# Patient Record
Sex: Female | Born: 1995 | Race: Black or African American | Hispanic: No | Marital: Single | Smoking: Never smoker
Health system: Southern US, Community
[De-identification: ages and names within clinical notes are randomized; demographics above are authoritative.]

## PROBLEM LIST (undated history)

## (undated) HISTORY — PX: ECTOPIC PREGNANCY SURGERY: SHX613

---

## 2017-04-03 HISTORY — PX: UNILATERAL SALPINGECTOMY: SHX6160

## 2018-12-19 DIAGNOSIS — K0889 Other specified disorders of teeth and supporting structures: Secondary | ICD-10-CM | POA: Diagnosis not present

## 2018-12-19 DIAGNOSIS — R220 Localized swelling, mass and lump, head: Secondary | ICD-10-CM | POA: Diagnosis not present

## 2018-12-19 DIAGNOSIS — R51 Headache: Secondary | ICD-10-CM | POA: Diagnosis not present

## 2019-01-16 ENCOUNTER — Ambulatory Visit: Payer: Medicaid Other | Admitting: Family Medicine

## 2019-01-28 ENCOUNTER — Other Ambulatory Visit: Payer: Medicaid Other

## 2019-02-07 ENCOUNTER — Other Ambulatory Visit: Payer: Medicaid Other

## 2019-02-13 ENCOUNTER — Other Ambulatory Visit: Payer: Self-pay

## 2019-02-13 ENCOUNTER — Encounter: Payer: Self-pay | Admitting: Emergency Medicine

## 2019-02-13 DIAGNOSIS — R519 Headache, unspecified: Secondary | ICD-10-CM | POA: Insufficient documentation

## 2019-02-13 DIAGNOSIS — Z5321 Procedure and treatment not carried out due to patient leaving prior to being seen by health care provider: Secondary | ICD-10-CM | POA: Diagnosis not present

## 2019-02-13 NOTE — ED Triage Notes (Signed)
Patient ambulatory to triage with steady gait, without difficulty or distress noted, mask in place; pt reports x 3 days having frontal HA with no accomp symptoms; st hx of same

## 2019-02-14 ENCOUNTER — Emergency Department
Admission: EM | Admit: 2019-02-14 | Discharge: 2019-02-14 | Disposition: A | Discharge status: Home / Self Care | Payer: Medicaid Other | Attending: Emergency Medicine | Admitting: Emergency Medicine

## 2019-02-14 ENCOUNTER — Other Ambulatory Visit: Payer: Medicaid Other

## 2019-08-11 ENCOUNTER — Other Ambulatory Visit: Payer: Self-pay

## 2019-08-21 ENCOUNTER — Other Ambulatory Visit: Payer: Self-pay

## 2019-08-21 ENCOUNTER — Encounter: Payer: Self-pay | Admitting: Emergency Medicine

## 2019-08-21 DIAGNOSIS — O209 Hemorrhage in early pregnancy, unspecified: Secondary | ICD-10-CM | POA: Diagnosis not present

## 2019-08-21 DIAGNOSIS — O99891 Other specified diseases and conditions complicating pregnancy: Secondary | ICD-10-CM | POA: Insufficient documentation

## 2019-08-21 DIAGNOSIS — Z5321 Procedure and treatment not carried out due to patient leaving prior to being seen by health care provider: Secondary | ICD-10-CM | POA: Insufficient documentation

## 2019-08-21 DIAGNOSIS — R1084 Generalized abdominal pain: Secondary | ICD-10-CM | POA: Insufficient documentation

## 2019-08-21 DIAGNOSIS — Z3A00 Weeks of gestation of pregnancy not specified: Secondary | ICD-10-CM | POA: Insufficient documentation

## 2019-08-21 LAB — CBC WITH DIFFERENTIAL/PLATELET
Abs Immature Granulocytes: 0.03 10*3/uL (ref 0.00–0.07)
Basophils Absolute: 0 10*3/uL (ref 0.0–0.1)
Basophils Relative: 0 %
Eosinophils Absolute: 0 10*3/uL (ref 0.0–0.5)
Eosinophils Relative: 0 %
HCT: 33.5 % — ABNORMAL LOW (ref 36.0–46.0)
Hemoglobin: 10.9 g/dL — ABNORMAL LOW (ref 12.0–15.0)
Immature Granulocytes: 0 %
Lymphocytes Relative: 17 %
Lymphs Abs: 2 10*3/uL (ref 0.7–4.0)
MCH: 28 pg (ref 26.0–34.0)
MCHC: 32.5 g/dL (ref 30.0–36.0)
MCV: 86.1 fL (ref 80.0–100.0)
Monocytes Absolute: 0.9 10*3/uL (ref 0.1–1.0)
Monocytes Relative: 8 %
Neutro Abs: 8.6 10*3/uL — ABNORMAL HIGH (ref 1.7–7.7)
Neutrophils Relative %: 75 %
Platelets: 328 10*3/uL (ref 150–400)
RBC: 3.89 MIL/uL (ref 3.87–5.11)
RDW: 12.7 % (ref 11.5–15.5)
WBC: 11.5 10*3/uL — ABNORMAL HIGH (ref 4.0–10.5)
nRBC: 0 % (ref 0.0–0.2)

## 2019-08-21 LAB — POCT PREGNANCY, URINE: Preg Test, Ur: POSITIVE — AB

## 2019-08-21 NOTE — ED Triage Notes (Signed)
Patient ambulatory to triage with steady gait, without difficulty or distress noted, mask in place; pt reports that she is pregnant per home preg test, unsure of how far along; having lower abd cramping and some spotting when she wipes; G3P1

## 2019-08-22 ENCOUNTER — Encounter: Payer: Self-pay | Admitting: Advanced Practice Midwife

## 2019-08-22 ENCOUNTER — Emergency Department
Admission: EM | Admit: 2019-08-22 | Discharge: 2019-08-22 | Disposition: A | Discharge status: Home / Self Care | Payer: Medicaid Other | Attending: Emergency Medicine | Admitting: Emergency Medicine

## 2019-08-22 DIAGNOSIS — M7989 Other specified soft tissue disorders: Secondary | ICD-10-CM | POA: Diagnosis not present

## 2019-08-22 DIAGNOSIS — O9A211 Injury, poisoning and certain other consequences of external causes complicating pregnancy, first trimester: Secondary | ICD-10-CM | POA: Diagnosis not present

## 2019-08-22 DIAGNOSIS — W19XXXA Unspecified fall, initial encounter: Secondary | ICD-10-CM | POA: Diagnosis not present

## 2019-08-22 DIAGNOSIS — Z3A01 Less than 8 weeks gestation of pregnancy: Secondary | ICD-10-CM | POA: Diagnosis not present

## 2019-08-22 DIAGNOSIS — S299XXA Unspecified injury of thorax, initial encounter: Secondary | ICD-10-CM | POA: Diagnosis not present

## 2019-08-22 DIAGNOSIS — S7001XA Contusion of right hip, initial encounter: Secondary | ICD-10-CM | POA: Diagnosis not present

## 2019-08-22 DIAGNOSIS — M25512 Pain in left shoulder: Secondary | ICD-10-CM | POA: Diagnosis not present

## 2019-08-22 LAB — COMPREHENSIVE METABOLIC PANEL
ALT: 21 U/L (ref 0–44)
AST: 24 U/L (ref 15–41)
Albumin: 4.2 g/dL (ref 3.5–5.0)
Alkaline Phosphatase: 38 U/L (ref 38–126)
Anion gap: 10 (ref 5–15)
BUN: 10 mg/dL (ref 6–20)
CO2: 23 mmol/L (ref 22–32)
Calcium: 9.1 mg/dL (ref 8.9–10.3)
Chloride: 106 mmol/L (ref 98–111)
Creatinine, Ser: 0.64 mg/dL (ref 0.44–1.00)
GFR calc Af Amer: >60 mL/min (ref >60)
GFR calc non Af Amer: >60 mL/min (ref >60)
Glucose, Bld: 95 mg/dL (ref 70–99)
Potassium: 3.4 mmol/L — ABNORMAL LOW (ref 3.5–5.1)
Sodium: 139 mmol/L (ref 135–145)
Total Bilirubin: 0.7 mg/dL (ref 0.3–1.2)
Total Protein: 7.7 g/dL (ref 6.5–8.1)

## 2019-08-22 LAB — URINALYSIS, COMPLETE (UACMP) WITH MICROSCOPIC
Bacteria, UA: NONE SEEN
Bilirubin Urine: NEGATIVE
Glucose, UA: NEGATIVE mg/dL
Hgb urine dipstick: NEGATIVE
Ketones, ur: 80 mg/dL — AB
Leukocytes,Ua: NEGATIVE
Nitrite: NEGATIVE
Protein, ur: NEGATIVE mg/dL
Specific Gravity, Urine: 1.017 (ref 1.005–1.030)
pH: 5 (ref 5.0–8.0)

## 2019-08-22 LAB — ABO/RH: ABO/RH(D): B POS

## 2019-08-22 LAB — HCG, QUANTITATIVE, PREGNANCY: hCG, Beta Chain, Quant, S: 4565 m[IU]/mL — ABNORMAL HIGH (ref <5)

## 2019-08-22 NOTE — ED Notes (Signed)
No answer when called several times from lobby 

## 2019-09-17 DIAGNOSIS — S0990XA Unspecified injury of head, initial encounter: Secondary | ICD-10-CM | POA: Diagnosis not present

## 2019-09-17 DIAGNOSIS — Z3A01 Less than 8 weeks gestation of pregnancy: Secondary | ICD-10-CM | POA: Diagnosis not present

## 2019-09-17 DIAGNOSIS — O23591 Infection of other part of genital tract in pregnancy, first trimester: Secondary | ICD-10-CM | POA: Diagnosis not present

## 2019-09-17 DIAGNOSIS — O0911 Supervision of pregnancy with history of ectopic or molar pregnancy, first trimester: Secondary | ICD-10-CM | POA: Diagnosis not present

## 2019-09-17 DIAGNOSIS — G8911 Acute pain due to trauma: Secondary | ICD-10-CM | POA: Diagnosis not present

## 2019-09-17 DIAGNOSIS — N760 Acute vaginitis: Secondary | ICD-10-CM | POA: Diagnosis not present

## 2019-09-17 DIAGNOSIS — R109 Unspecified abdominal pain: Secondary | ICD-10-CM | POA: Diagnosis not present

## 2019-09-17 DIAGNOSIS — S0512XA Contusion of eyeball and orbital tissues, left eye, initial encounter: Secondary | ICD-10-CM | POA: Diagnosis not present

## 2019-09-17 DIAGNOSIS — B9689 Other specified bacterial agents as the cause of diseases classified elsewhere: Secondary | ICD-10-CM | POA: Diagnosis not present

## 2019-09-17 DIAGNOSIS — O9A211 Injury, poisoning and certain other consequences of external causes complicating pregnancy, first trimester: Secondary | ICD-10-CM | POA: Diagnosis not present

## 2019-09-17 DIAGNOSIS — S0012XA Contusion of left eyelid and periocular area, initial encounter: Secondary | ICD-10-CM | POA: Diagnosis not present

## 2019-09-17 DIAGNOSIS — O99891 Other specified diseases and conditions complicating pregnancy: Secondary | ICD-10-CM | POA: Diagnosis not present

## 2019-09-17 DIAGNOSIS — H1132 Conjunctival hemorrhage, left eye: Secondary | ICD-10-CM | POA: Diagnosis not present

## 2019-09-17 DIAGNOSIS — S0993XA Unspecified injury of face, initial encounter: Secondary | ICD-10-CM | POA: Diagnosis not present

## 2019-09-17 DIAGNOSIS — M549 Dorsalgia, unspecified: Secondary | ICD-10-CM | POA: Diagnosis not present

## 2019-09-17 DIAGNOSIS — O26851 Spotting complicating pregnancy, first trimester: Secondary | ICD-10-CM | POA: Diagnosis not present

## 2019-09-18 DIAGNOSIS — S0993XA Unspecified injury of face, initial encounter: Secondary | ICD-10-CM | POA: Diagnosis not present

## 2019-09-18 DIAGNOSIS — S0990XA Unspecified injury of head, initial encounter: Secondary | ICD-10-CM | POA: Diagnosis not present

## 2019-09-18 DIAGNOSIS — O26851 Spotting complicating pregnancy, first trimester: Secondary | ICD-10-CM | POA: Diagnosis not present

## 2019-10-17 DIAGNOSIS — N898 Other specified noninflammatory disorders of vagina: Secondary | ICD-10-CM | POA: Diagnosis not present

## 2019-10-17 DIAGNOSIS — R300 Dysuria: Secondary | ICD-10-CM | POA: Diagnosis not present

## 2019-10-17 DIAGNOSIS — Z01419 Encounter for gynecological examination (general) (routine) without abnormal findings: Secondary | ICD-10-CM | POA: Diagnosis not present

## 2019-10-17 DIAGNOSIS — Z113 Encounter for screening for infections with a predominantly sexual mode of transmission: Secondary | ICD-10-CM | POA: Diagnosis not present

## 2019-10-20 DIAGNOSIS — Z3200 Encounter for pregnancy test, result unknown: Secondary | ICD-10-CM | POA: Diagnosis not present

## 2019-10-20 DIAGNOSIS — Z3201 Encounter for pregnancy test, result positive: Secondary | ICD-10-CM | POA: Diagnosis not present

## 2019-10-23 DIAGNOSIS — Z3201 Encounter for pregnancy test, result positive: Secondary | ICD-10-CM | POA: Diagnosis not present

## 2019-11-26 DIAGNOSIS — Z01818 Encounter for other preprocedural examination: Secondary | ICD-10-CM | POA: Diagnosis not present

## 2019-11-26 DIAGNOSIS — Z3042 Encounter for surveillance of injectable contraceptive: Secondary | ICD-10-CM | POA: Diagnosis not present

## 2020-06-02 DIAGNOSIS — H5213 Myopia, bilateral: Secondary | ICD-10-CM | POA: Diagnosis not present

## 2020-06-24 DIAGNOSIS — H52223 Regular astigmatism, bilateral: Secondary | ICD-10-CM | POA: Diagnosis not present

## 2020-06-25 ENCOUNTER — Encounter: Payer: Self-pay | Admitting: Emergency Medicine

## 2020-06-25 ENCOUNTER — Ambulatory Visit
Admission: EM | Admit: 2020-06-25 | Discharge: 2020-06-25 | Disposition: A | Discharge status: Home / Self Care | Payer: Medicaid Other | Attending: Family Medicine | Admitting: Family Medicine

## 2020-06-25 ENCOUNTER — Other Ambulatory Visit: Payer: Self-pay

## 2020-06-25 ENCOUNTER — Ambulatory Visit: Admit: 2020-06-25 | Disposition: A | Discharge status: Home / Self Care | Payer: Self-pay

## 2020-06-25 DIAGNOSIS — R519 Headache, unspecified: Secondary | ICD-10-CM | POA: Diagnosis not present

## 2020-06-25 DIAGNOSIS — R61 Generalized hyperhidrosis: Secondary | ICD-10-CM | POA: Diagnosis not present

## 2020-06-25 DIAGNOSIS — Z3202 Encounter for pregnancy test, result negative: Secondary | ICD-10-CM | POA: Diagnosis not present

## 2020-06-25 DIAGNOSIS — R1032 Left lower quadrant pain: Secondary | ICD-10-CM

## 2020-06-25 DIAGNOSIS — R1031 Right lower quadrant pain: Secondary | ICD-10-CM

## 2020-06-25 DIAGNOSIS — Z113 Encounter for screening for infections with a predominantly sexual mode of transmission: Secondary | ICD-10-CM | POA: Insufficient documentation

## 2020-06-25 DIAGNOSIS — Z5189 Encounter for other specified aftercare: Secondary | ICD-10-CM | POA: Diagnosis not present

## 2020-06-25 LAB — POCT URINALYSIS DIP (MANUAL ENTRY)
Bilirubin, UA: NEGATIVE
Glucose, UA: NEGATIVE mg/dL
Leukocytes, UA: NEGATIVE
Nitrite, UA: NEGATIVE
Spec Grav, UA: >=1.03 — AB (ref 1.010–1.025)
Urobilinogen, UA: 0.2 E.U./dL
pH, UA: 6 (ref 5.0–8.0)

## 2020-06-25 LAB — POCT URINE PREGNANCY: Preg Test, Ur: NEGATIVE

## 2020-06-25 NOTE — ED Triage Notes (Signed)
Patient c/o ABD cramping x 3 days.   Patient states " I'm concerned I was supposed to get my period 6 days ago, I was supposed to get my depo shot in November but missed it so I want to make sure I'm not pregnant".   Patient denies any worsening of symptoms.   Patient endorses headaches and back pain.   Patient denies any urinary symptoms and abnormal vaginal discharge.   Patient hasn't used any medications for ABD cramping.

## 2020-06-25 NOTE — Discharge Instructions (Addendum)
We are sending a swab for STD screening.  Your urine did not show any pregnancy or infection today.  Sending blood test for pregnancy. You can check my chart for results.  Follow up as needed for continued or worsening symptoms See OB/GYN as needed.

## 2020-06-26 LAB — BETA HCG QUANT (REF LAB): hCG Quant: <1 m[IU]/mL

## 2020-06-28 ENCOUNTER — Telehealth (HOSPITAL_COMMUNITY): Payer: Self-pay | Admitting: Emergency Medicine

## 2020-06-28 LAB — CERVICOVAGINAL ANCILLARY ONLY
Bacterial Vaginitis (gardnerella): POSITIVE — AB
Candida Glabrata: NEGATIVE
Candida Vaginitis: POSITIVE — AB
Chlamydia: NEGATIVE
Comment: NEGATIVE
Comment: NEGATIVE
Comment: NEGATIVE
Comment: NEGATIVE
Comment: NEGATIVE
Comment: NORMAL
Neisseria Gonorrhea: NEGATIVE
Trichomonas: NEGATIVE

## 2020-06-28 LAB — URINE CULTURE: Culture: >=100000 — AB

## 2020-06-28 MED ORDER — SULFAMETHOXAZOLE-TRIMETHOPRIM 800-160 MG PO TABS
1.0000 | ORAL_TABLET | Freq: Two times a day (BID) | ORAL | 0 refills | Status: AC
Start: 2020-06-28 — End: 2020-07-01

## 2020-06-28 NOTE — ED Provider Notes (Signed)
Wanda Schultz    CSN: [id number] Arrival date & time: 06/25/20  1512      History   Chief Complaint Chief Complaint  Patient presents with  . Abdominal Pain    HPI Wanda Schultz is a 25 y.o. female.   Patient is a 25 year old female who presents today with complaints of lower abdominal cramping.  This is been for 3 days.  Concerned that she is pregnant due to irregular.'s.  Took multiple pregnancy tests at home that were negative.  She is also has some mild headache and back pain.  Denies any dysuria, hematuria or urinary frequency.  Denies any vaginal discharge, itching or irritation.  No specific concerns for STDs.  Is currently sexually active and not on birth control.   Abdominal Pain   History reviewed. No pertinent past medical history.  There are no problems to display for this patient.   Past Surgical History:  Procedure Laterality Date  . ECTOPIC PREGNANCY SURGERY      OB History    Gravida  1   Para      Term      Preterm      AB      Living        SAB      IAB      Ectopic      Multiple      Live Births               Home Medications    Prior to Admission medications   Not on File    Family History History reviewed. No pertinent family history.  Social History Social History   Tobacco Use  . Smoking status: Never Smoker  . Smokeless tobacco: Never Used  Vaping Use  . Vaping Use: Never used  Substance Use Topics  . Alcohol use: Yes  . Drug use: Never     Allergies   Patient has no known allergies.   Review of Systems Review of Systems  Gastrointestinal: Positive for abdominal pain.     Physical Exam Triage Vital Signs ED Triage Vitals  Enc Vitals Group     BP 06/25/20 1531 112/72     Pulse Rate 06/25/20 1531 92     Resp 06/25/20 1531 16     Temp 06/25/20 1531 99 F (37.2 C)     Temp Source 06/25/20 1531 Oral     SpO2 06/25/20 1531 96 %     Weight --      Height --      Head Circumference --       Peak Flow --      Pain Score 06/25/20 1529 3     Pain Loc --      Pain Edu? --      Excl. in GC? --    No data found.  Updated Vital Signs BP 112/72 (BP Location: Right Arm)   Pulse 92   Temp 99 F (37.2 C) (Oral)   Resp 16   LMP 06/02/2020 (Approximate)   SpO2 96%   Breastfeeding Unknown   Visual Acuity Right Eye Distance:   Left Eye Distance:   Bilateral Distance:    Right Eye Near:   Left Eye Near:    Bilateral Near:     Physical Exam Vitals and nursing note reviewed.  Constitutional:      General: She is not in acute distress.    Appearance: Normal appearance. She is not ill-appearing, toxic-appearing or diaphoretic.  HENT:     Head: Normocephalic.  Eyes:     Conjunctiva/sclera: Conjunctivae normal.  Pulmonary:     Effort: Pulmonary effort is normal.  Musculoskeletal:        General: Normal range of motion.     Cervical back: Normal range of motion.  Skin:    General: Skin is warm and dry.     Findings: No rash.  Neurological:     Mental Status: She is alert.  Psychiatric:        Mood and Affect: Mood normal.      UC Treatments / Results  Labs (all labs ordered are listed, but only abnormal results are displayed) Labs Reviewed  URINE CULTURE - Abnormal; Notable for the following components:      Result Value   Culture   (*)    Value: >=100,000 COLONIES/mL PROTEUS MIRABILIS SUSCEPTIBILITIES TO FOLLOW Performed at [facility], [address] [place], [place] [address]    All other components within normal limits  POCT URINALYSIS DIP (MANUAL ENTRY) - Abnormal; Notable for the following components:   Ketones, POC UA trace (5) (*)    Spec Grav, UA >=1.030 (*)    Blood, UA trace-intact (*)    Protein Ur, POC trace (*)    All other components within normal limits  HCG, QUANTITATIVE, PREGNANCY  POCT URINE PREGNANCY  CERVICOVAGINAL ANCILLARY ONLY    EKG   Radiology No results found.  Procedures Procedures (including critical  care time)  Medications Ordered in UC Medications - No data to display  Initial Impression / Assessment and Plan / UC Course  I have reviewed the triage vital signs and the nursing notes.  Pertinent labs & imaging results that were available during my care of the patient were reviewed by me and considered in my medical decision making (see chart for details).     Urine pregnancy test negative.  Urine with trace blood, trace ketones and trace protein otherwise no leukocytes or nitrates.  Sending for culture. Self swab sent for testing Blood hCG drawn per patient request Most likely irregular periods due to urinary changes and coming off the Depo shot.  Recommended follow-up with OB/GYN as needed. Labs pending.   Final Clinical Impressions(s) / UC Diagnoses   Final diagnoses:  Urine pregnancy test negative  Screening examination for STD (sexually transmitted disease)     Discharge Instructions     We are sending a swab for STD screening.  Your urine did not show any pregnancy or infection today.  Sending blood test for pregnancy. You can check my chart for results.  Follow up as needed for continued or worsening symptoms See OB/GYN as needed.      ED Prescriptions    None     PDMP not reviewed this encounter.   Janace Aris, NP 06/28/20 [phone]

## 2020-06-29 ENCOUNTER — Telehealth (HOSPITAL_COMMUNITY): Payer: Self-pay | Admitting: Emergency Medicine

## 2020-06-29 MED ORDER — FLUCONAZOLE 150 MG PO TABS
150.0000 mg | ORAL_TABLET | Freq: Once | ORAL | 0 refills | Status: AC
Start: 2020-06-29 — End: 2020-06-29

## 2020-06-29 MED ORDER — METRONIDAZOLE 500 MG PO TABS: 500.0000 mg | ORAL_TABLET | Freq: Two times a day (BID) | ORAL | 0 refills | Status: DC

## 2020-09-08 ENCOUNTER — Other Ambulatory Visit: Payer: Self-pay

## 2020-09-08 ENCOUNTER — Ambulatory Visit
Admission: EM | Admit: 2020-09-08 | Discharge: 2020-09-08 | Disposition: A | Discharge status: Home / Self Care | Payer: Medicaid Other | Attending: Emergency Medicine | Admitting: Emergency Medicine

## 2020-09-08 DIAGNOSIS — B9689 Other specified bacterial agents as the cause of diseases classified elsewhere: Secondary | ICD-10-CM | POA: Diagnosis not present

## 2020-09-08 DIAGNOSIS — N760 Acute vaginitis: Secondary | ICD-10-CM | POA: Diagnosis not present

## 2020-09-08 LAB — URINALYSIS, COMPLETE (UACMP) WITH MICROSCOPIC
Bilirubin Urine: NEGATIVE
Glucose, UA: NEGATIVE mg/dL
Ketones, ur: NEGATIVE mg/dL
Leukocytes,Ua: NEGATIVE
Nitrite: NEGATIVE
Protein, ur: NEGATIVE mg/dL
Specific Gravity, Urine: 1.02 (ref 1.005–1.030)
pH: 6.5 (ref 5.0–8.0)

## 2020-09-08 LAB — WET PREP, GENITAL
Sperm: NONE SEEN
Trich, Wet Prep: NONE SEEN
WBC, Wet Prep HPF POC: NONE SEEN
Yeast Wet Prep HPF POC: NONE SEEN

## 2020-09-08 LAB — PREGNANCY, URINE: Preg Test, Ur: NEGATIVE

## 2020-09-08 MED ORDER — METRONIDAZOLE 500 MG PO TABS: 500.0000 mg | ORAL_TABLET | Freq: Two times a day (BID) | ORAL | 0 refills | Status: DC

## 2020-09-08 NOTE — ED Provider Notes (Signed)
[facility]    CSN: [id number] Arrival date & time: 09/08/20  1142      History   Chief Complaint Chief Complaint  Patient presents with  . Abdominal Pain    HPI Wanda Schultz is a 25 y.o. female.   HPI   25 year old female here for evaluation of lower abdominal pain.  Patient reports that she has been having lower abdominal pain for the past week.  She also reports that she has been waking up feeling nauseous every morning, has had some constipation, and urinary frequency.  She denies fever, vomiting or diarrhea, painful urination, blood in her urine, or urinary urgency.  Patient reports that her last normal menstrual period was 08/16/2020 but she took 3 home pregnancy tests that were all faintly positive and she is here for a blood test.  History reviewed. No pertinent past medical history.  There are no problems to display for this patient.   Past Surgical History:  Procedure Laterality Date  . ECTOPIC PREGNANCY SURGERY      OB History    Gravida  1   Para      Term      Preterm      AB      Living        SAB      IAB      Ectopic      Multiple      Live Births               Home Medications    Prior to Admission medications   Medication Sig Start Date End Date Taking? Authorizing Provider  metroNIDAZOLE (FLAGYL) 500 MG tablet Take 1 tablet (500 mg total) by mouth 2 (two) times daily. 09/08/20   Becky Augusta, NP    Family History History reviewed. No pertinent family history.  Social History Social History   Tobacco Use  . Smoking status: Never Smoker  . Smokeless tobacco: Never Used  Vaping Use  . Vaping Use: Every day  Substance Use Topics  . Alcohol use: Yes    Comment: social  . Drug use: Never     Allergies   Patient has no known allergies.   Review of Systems Review of Systems  Constitutional: Negative for activity change, appetite change and fever.  Gastrointestinal: Positive for abdominal pain,  constipation and nausea. Negative for diarrhea and vomiting.  Genitourinary: Positive for frequency. Negative for dysuria, hematuria, urgency, vaginal bleeding, vaginal discharge and vaginal pain.  Musculoskeletal: Negative for back pain.  Skin: Negative for rash.  Hematological: Negative.   Psychiatric/Behavioral: Negative.      Physical Exam Triage Vital Signs ED Triage Vitals [09/08/20 1157]  Enc Vitals Group     BP      Pulse      Resp      Temp      Temp src      SpO2      Weight      Height      Head Circumference      Peak Flow      Pain Score 2     Pain Loc      Pain Edu?      Excl. in GC?    No data found.  Updated Vital Signs BP 128/64 (BP Location: Left Arm)   Pulse 79   Temp 99.2 F (37.3 C) (Oral)   Resp 16   Ht 5\' 1"  (1.549 m)   Wt 190 lb (  86.2 kg)   LMP 08/16/2020   SpO2 99%   BMI 35.90 kg/m   Visual Acuity Right Eye Distance:   Left Eye Distance:   Bilateral Distance:    Right Eye Near:   Left Eye Near:    Bilateral Near:     Physical Exam Vitals and nursing note reviewed.  Constitutional:      General: She is not in acute distress.    Appearance: Normal appearance. She is well-developed. She is obese. She is not ill-appearing.  HENT:     Head: Normocephalic and atraumatic.  Cardiovascular:     Rate and Rhythm: Normal rate and regular rhythm.     Pulses: Normal pulses.     Heart sounds: Normal heart sounds. No murmur heard. No gallop.   Pulmonary:     Effort: Pulmonary effort is normal.     Breath sounds: Normal breath sounds. No wheezing, rhonchi or rales.  Abdominal:     General: Abdomen is flat. Bowel sounds are normal.     Palpations: Abdomen is soft.     Tenderness: There is abdominal tenderness. There is no right CVA tenderness, left CVA tenderness, guarding or rebound.  Skin:    General: Skin is warm and dry.     Capillary Refill: Capillary refill takes less than 2 seconds.     Findings: No erythema or rash.   Neurological:     General: No focal deficit present.     Mental Status: She is alert and oriented to person, place, and time.  Psychiatric:        Mood and Affect: Mood normal.        Behavior: Behavior normal.        Thought Content: Thought content normal.        Judgment: Judgment normal.      UC Treatments / Results  Labs (all labs ordered are listed, but only abnormal results are displayed) Labs Reviewed  WET PREP, GENITAL - Abnormal; Notable for the following components:      Result Value   Clue Cells Wet Prep HPF POC PRESENT (*)    All other components within normal limits  URINALYSIS, COMPLETE (UACMP) WITH MICROSCOPIC - Abnormal; Notable for the following components:   Hgb urine dipstick TRACE (*)    Bacteria, UA FEW (*)    All other components within normal limits  PREGNANCY, URINE    EKG   Radiology No results found.  Procedures Procedures (including critical care time)  Medications Ordered in UC Medications - No data to display  Initial Impression / Assessment and Plan / UC Course  I have reviewed the triage vital signs and the nursing notes.  Pertinent labs & imaging results that were available during my care of the patient were reviewed by me and considered in my medical decision making (see chart for details).   Patient is a very pleasant, nontoxic-appearing 25 year old female here for evaluation of lower abdominal pain and 3 faintly positive home pregnancy tests as described in the HPI above.  Patient's physical exam reveals a benign cardiopulmonary exam.  Abdomen is soft, flat with positive bowel sounds all 4 quadrants.  Patient does have mild suprapubic tenderness on exam.  Given patient's family positive home pregnancy test we will collect UA, U-preg and wet prep.  Urine pregnancy test is negative.  Urinalysis shows trace hemoglobin and few bacteria but is otherwise unremarkable.  Wet prep is clue cell positive.  Will treat patient for BV with  metronidazole twice  daily for 7 days.   Final Clinical Impressions(s) / UC Diagnoses   Final diagnoses:  Bacterial vaginosis     Discharge Instructions     Take the Flagyl twice daily for 7 days for treatment of your bacterial vaginosis.  Follow-up with your primary care provider for any new or worsening symptoms.    ED Prescriptions    Medication Sig Dispense Auth. Provider   metroNIDAZOLE (FLAGYL) 500 MG tablet Take 1 tablet (500 mg total) by mouth 2 (two) times daily. 14 tablet Becky Augusta, NP     PDMP not reviewed this encounter.   Becky Augusta, NP 09/08/20 1241

## 2020-09-08 NOTE — ED Triage Notes (Addendum)
Pt with low abdominal pain which she thought was her period coming on.  Hasn't missed a period yet but took 3 home pregnancy tests and all had a very faint positive line. Has been waking feeling nauseated. Pt would like a blood pregnancy test

## 2020-09-08 NOTE — Discharge Instructions (Addendum)
Take the Flagyl twice daily for 7 days for treatment of your bacterial vaginosis.  Follow-up with your primary care provider for any new or worsening symptoms.

## 2020-11-22 DIAGNOSIS — R519 Headache, unspecified: Secondary | ICD-10-CM | POA: Diagnosis not present

## 2020-12-12 DIAGNOSIS — R8271 Bacteriuria: Secondary | ICD-10-CM | POA: Diagnosis not present

## 2020-12-12 DIAGNOSIS — O219 Vomiting of pregnancy, unspecified: Secondary | ICD-10-CM | POA: Diagnosis not present

## 2020-12-12 DIAGNOSIS — O26891 Other specified pregnancy related conditions, first trimester: Secondary | ICD-10-CM | POA: Diagnosis not present

## 2020-12-12 DIAGNOSIS — R630 Anorexia: Secondary | ICD-10-CM | POA: Diagnosis not present

## 2020-12-12 DIAGNOSIS — Z20822 Contact with and (suspected) exposure to covid-19: Secondary | ICD-10-CM | POA: Diagnosis not present

## 2020-12-12 DIAGNOSIS — Z3A01 Less than 8 weeks gestation of pregnancy: Secondary | ICD-10-CM | POA: Diagnosis not present

## 2020-12-12 DIAGNOSIS — R1084 Generalized abdominal pain: Secondary | ICD-10-CM | POA: Diagnosis not present

## 2020-12-12 DIAGNOSIS — R109 Unspecified abdominal pain: Secondary | ICD-10-CM | POA: Diagnosis not present

## 2021-01-11 DIAGNOSIS — Z3481 Encounter for supervision of other normal pregnancy, first trimester: Secondary | ICD-10-CM | POA: Diagnosis not present

## 2021-01-11 DIAGNOSIS — Z8742 Personal history of other diseases of the female genital tract: Secondary | ICD-10-CM | POA: Diagnosis not present

## 2021-01-11 DIAGNOSIS — N898 Other specified noninflammatory disorders of vagina: Secondary | ICD-10-CM | POA: Diagnosis not present

## 2021-01-11 DIAGNOSIS — O468X1 Other antepartum hemorrhage, first trimester: Secondary | ICD-10-CM | POA: Diagnosis not present

## 2021-01-11 DIAGNOSIS — Z113 Encounter for screening for infections with a predominantly sexual mode of transmission: Secondary | ICD-10-CM | POA: Diagnosis not present

## 2021-01-11 DIAGNOSIS — O26891 Other specified pregnancy related conditions, first trimester: Secondary | ICD-10-CM | POA: Diagnosis not present

## 2021-01-11 DIAGNOSIS — O99891 Other specified diseases and conditions complicating pregnancy: Secondary | ICD-10-CM | POA: Diagnosis not present

## 2021-01-11 DIAGNOSIS — O99211 Obesity complicating pregnancy, first trimester: Secondary | ICD-10-CM | POA: Diagnosis not present

## 2021-01-11 DIAGNOSIS — N83291 Other ovarian cyst, right side: Secondary | ICD-10-CM | POA: Diagnosis not present

## 2021-01-11 DIAGNOSIS — E669 Obesity, unspecified: Secondary | ICD-10-CM | POA: Diagnosis not present

## 2021-01-11 DIAGNOSIS — O418X10 Other specified disorders of amniotic fluid and membranes, first trimester, not applicable or unspecified: Secondary | ICD-10-CM | POA: Diagnosis not present

## 2021-01-13 ENCOUNTER — Telehealth: Payer: Self-pay | Admitting: Licensed Clinical Social Worker

## 2021-01-13 NOTE — Telephone Encounter (Signed)
-----   Message from Esmeralda Links, RN sent at 01/11/2021  4:13 PM EDT ----- Wanda Schultz please accept this as a referral for the above client. She reports that she changes phone numbers often. I received 2 phone numbers for her today [phone] and [phone]  Thank You

## 2021-06-10 ENCOUNTER — Ambulatory Visit
Admission: EM | Admit: 2021-06-10 | Discharge: 2021-06-10 | Disposition: A | Discharge status: Home / Self Care | Payer: Medicaid Other | Attending: Emergency Medicine | Admitting: Emergency Medicine

## 2021-06-10 ENCOUNTER — Other Ambulatory Visit: Payer: Self-pay

## 2021-06-10 DIAGNOSIS — Z202 Contact with and (suspected) exposure to infections with a predominantly sexual mode of transmission: Secondary | ICD-10-CM | POA: Diagnosis not present

## 2021-06-10 DIAGNOSIS — B9689 Other specified bacterial agents as the cause of diseases classified elsewhere: Secondary | ICD-10-CM | POA: Diagnosis not present

## 2021-06-10 DIAGNOSIS — N760 Acute vaginitis: Secondary | ICD-10-CM | POA: Diagnosis not present

## 2021-06-10 LAB — WET PREP, GENITAL
Sperm: NONE SEEN
Trich, Wet Prep: NONE SEEN
WBC, Wet Prep HPF POC: <10 — AB (ref <10)
Yeast Wet Prep HPF POC: NONE SEEN

## 2021-06-10 MED ORDER — DOXYCYCLINE HYCLATE 100 MG PO CAPS: 100.0000 mg | ORAL_CAPSULE | Freq: Two times a day (BID) | ORAL | 0 refills | Status: AC

## 2021-06-10 MED ORDER — CEFTRIAXONE SODIUM 1 G IJ SOLR
0.5000 g | Freq: Once | INTRAMUSCULAR | Status: AC
  Administered 2021-06-10: 0.5 g via INTRAMUSCULAR

## 2021-06-10 MED ORDER — METRONIDAZOLE 500 MG PO TABS: 500.0000 mg | ORAL_TABLET | Freq: Two times a day (BID) | ORAL | 0 refills | Status: AC

## 2021-06-10 NOTE — Discharge Instructions (Addendum)
Finish doxycycline, unless your chlamydia is negative.  If your chlamydia is negative, you can discontinue it.  We have treated you empirically for gonorrhea today with a shot of Rocephin.  You also have bacterial vaginosis.  Finish the Flagyl, even if you feel better. Give [place] a working phone number so that we can contact you if needed. Refrain from sexual contact until all of your labs have come back, symptoms have resolved, and your partner(s) are treated if necessary.  ? ?Go to www.goodrx.com  or www.costplusdrugs.com to look up your medications. This will give you a list of where you can find your prescriptions at the most affordable prices. Or ask the pharmacist what the cash price is, or if they have any other discount programs available to help make your medication more affordable. This can be less expensive than what you would pay with insurance.   ?
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
## 2021-06-10 NOTE — ED Provider Notes (Signed)
HPI ? ?SUBJECTIVE: ? ?Wanda Schultz is a 26 y.o. female who presents with an exposure to chlamydia.  She states that her female partner tested positive for chlamydia 2 to 3 days ago.  She reports some vaginal odor and discharge, but has no urinary complaints, abnormal vaginal bleeding, rash, itching, blisters.  No nausea, vomiting, fevers, abdominal, back, pelvic pain.  Patient does not have any other sexual partners.  She has had 2 doses of metronidazole vaginal cream without improvement in her symptoms.  No aggravating factors.  She has a past medical history of chlamydia, BV and yeast.  No history of gonorrhea, HIV, HSV, syphilis, trichomonas, diabetes.  LMP: 2 to 3 weeks ago.  Denies the possibility of being pregnant.  [facility] [place] ? ? ?History reviewed. No pertinent past medical history. ? ?Past Surgical History:  ?Procedure Laterality Date  ? ECTOPIC PREGNANCY SURGERY    ? ? ?History reviewed. No pertinent family history. ? ?Social History  ? ?Tobacco Use  ? Smoking status: Never  ? Smokeless tobacco: Never  ?Vaping Use  ? Vaping Use: Every day  ?Substance Use Topics  ? Alcohol use: Yes  ?  Comment: social  ? Drug use: Never  ? ? ?No current facility-administered medications for this encounter. ? ?Current Outpatient Medications:  ?  doxycycline (VIBRAMYCIN) 100 MG capsule, Take 1 capsule (100 mg total) by mouth 2 (two) times daily for 7 days., Disp: 14 capsule, Rfl: 0 ?  metroNIDAZOLE (FLAGYL) 500 MG tablet, Take 1 tablet (500 mg total) by mouth 2 (two) times daily for 7 days., Disp: 14 tablet, Rfl: 0 ? ?No Known Allergies ? ? ?ROS ? ?As noted in HPI.  ? ?Physical Exam ? ?BP (!) 139/101 (BP Location: Right Arm)   Pulse 61   Temp 98.9 ?F (37.2 ?C) (Oral)   Resp 16   SpO2 100%  ? ?Constitutional: Well developed, well nourished, no acute distress ?Eyes:  EOMI, conjunctiva normal bilaterally ?HENT: Normocephalic, atraumatic,mucus membranes moist ?Respiratory: Normal inspiratory  effort ?Cardiovascular: Normal rate ?GI: nondistended soft, nontender. No suprapubic tenderness  ?back: No CVA tenderness ?GU: Deferred ?skin: No rash, skin intact ?Musculoskeletal: no deformities ?Neurologic: Alert & oriented x 3, no focal neuro deficits ?Psychiatric: Speech and behavior appropriate ? ? ?ED Course ? ? ?Medications  ?cefTRIAXone (ROCEPHIN) injection 0.5 g (0.5 g Intramuscular Given 06/10/21 1046)  ? ? ?Orders Placed This Encounter  ?Procedures  ? Wet prep, genital  ?  Standing Status:   Standing  ?  Number of Occurrences:   1  ? ? ?Results for orders placed or performed during the hospital encounter of 06/10/21 (from the past 24 hour(s))  ?Wet prep, genital     Status: Abnormal  ? Collection Time: 06/10/21 10:45 AM  ? Specimen: Vaginal  ?Result Value Ref Range  ? Yeast Wet Prep HPF POC NONE SEEN NONE SEEN  ? Trich, Wet Prep NONE SEEN NONE SEEN  ? Clue Cells Wet Prep HPF POC PRESENT (A) NONE SEEN  ? WBC, Wet Prep HPF POC <10 (A) <10  ? Sperm NONE SEEN   ? ?No results found. ? ?ED Clinical Impression ? ?1. Exposure to chlamydia   ?2. BV (bacterial vaginosis)   ? ? ?ED Assessment/Plan ? ?Patient exposed to chlamydia.  will test for gonorrhea chlamydia, also checking wet prep.  Sending home with doxycycline for a week, if her chlamydia is negative, she is to discontinue the doxycycline.  Patient declined urine pregnancy, HIV and RPR  testing.  She would like to be treated empirically for gonorrhea today.  Giving 500 mg of Rocephin.   ? ?Patient has BV.  Home with Flagyl ? ?Advised pt to refrain from sexual contact until she knows lab results, symptoms resolve, and partner(s) are treated if necessary. Pt provided working phone number. Follow-up with PMD as needed. Discussed labs, MDM, plan and followup with patient. Pt agrees with plan.  ? ?Meds ordered this encounter  ?Medications  ? cefTRIAXone (ROCEPHIN) injection 0.5 g  ? doxycycline (VIBRAMYCIN) 100 MG capsule  ?  Sig: Take 1 capsule (100 mg total) by  mouth 2 (two) times daily for 7 days.  ?  Dispense:  14 capsule  ?  Refill:  0  ? metroNIDAZOLE (FLAGYL) 500 MG tablet  ?  Sig: Take 1 tablet (500 mg total) by mouth 2 (two) times daily for 7 days.  ?  Dispense:  14 tablet  ?  Refill:  0  ? ? ?*This clinic note was created using Scientist, clinical (histocompatibility and immunogenetics). Therefore, there may be occasional mistakes despite careful proofreading. ? ?? ? ? ?  ?Domenick Gong, MD ?06/10/21 1059 ? ?

## 2021-06-13 LAB — CERVICOVAGINAL ANCILLARY ONLY
Chlamydia: POSITIVE — AB
Comment: NEGATIVE
Comment: NORMAL
Neisseria Gonorrhea: NEGATIVE

## 2021-07-03 ENCOUNTER — Other Ambulatory Visit: Payer: Self-pay

## 2021-07-03 ENCOUNTER — Emergency Department
Admission: EM | Admit: 2021-07-03 | Discharge: 2021-07-04 | Disposition: A | Discharge status: Home / Self Care | Payer: Medicaid Other | Attending: Emergency Medicine | Admitting: Emergency Medicine

## 2021-07-03 DIAGNOSIS — S299XXA Unspecified injury of thorax, initial encounter: Secondary | ICD-10-CM | POA: Insufficient documentation

## 2021-07-03 DIAGNOSIS — S161XXA Strain of muscle, fascia and tendon at neck level, initial encounter: Secondary | ICD-10-CM | POA: Diagnosis not present

## 2021-07-03 DIAGNOSIS — S169XXA Unspecified injury of muscle, fascia and tendon at neck level, initial encounter: Secondary | ICD-10-CM | POA: Diagnosis present

## 2021-07-03 DIAGNOSIS — M40204 Unspecified kyphosis, thoracic region: Secondary | ICD-10-CM | POA: Diagnosis not present

## 2021-07-03 DIAGNOSIS — S0990XA Unspecified injury of head, initial encounter: Secondary | ICD-10-CM | POA: Diagnosis not present

## 2021-07-03 DIAGNOSIS — M25512 Pain in left shoulder: Secondary | ICD-10-CM

## 2021-07-03 DIAGNOSIS — Y92410 Unspecified street and highway as the place of occurrence of the external cause: Secondary | ICD-10-CM | POA: Insufficient documentation

## 2021-07-03 DIAGNOSIS — R21 Rash and other nonspecific skin eruption: Secondary | ICD-10-CM | POA: Diagnosis not present

## 2021-07-03 DIAGNOSIS — S199XXA Unspecified injury of neck, initial encounter: Secondary | ICD-10-CM | POA: Diagnosis not present

## 2021-07-03 DIAGNOSIS — R0902 Hypoxemia: Secondary | ICD-10-CM | POA: Diagnosis not present

## 2021-07-03 DIAGNOSIS — S3993XA Unspecified injury of pelvis, initial encounter: Secondary | ICD-10-CM | POA: Diagnosis not present

## 2021-07-03 DIAGNOSIS — S4992XA Unspecified injury of left shoulder and upper arm, initial encounter: Secondary | ICD-10-CM | POA: Insufficient documentation

## 2021-07-03 DIAGNOSIS — S3992XA Unspecified injury of lower back, initial encounter: Secondary | ICD-10-CM | POA: Diagnosis not present

## 2021-07-03 DIAGNOSIS — S3991XA Unspecified injury of abdomen, initial encounter: Secondary | ICD-10-CM | POA: Diagnosis not present

## 2021-07-03 LAB — POC URINE PREG, ED: Preg Test, Ur: NEGATIVE

## 2021-07-03 MED ORDER — MORPHINE SULFATE (PF) 4 MG/ML IV SOLN
4.0000 mg | Freq: Once | INTRAVENOUS | Status: AC
  Administered 2021-07-04: 4 mg via INTRAVENOUS
  Filled 2021-07-03: qty 1

## 2021-07-03 MED ORDER — ONDANSETRON HCL 4 MG/2ML IJ SOLN
4.0000 mg | Freq: Once | INTRAMUSCULAR | Status: AC
  Administered 2021-07-04: 4 mg via INTRAVENOUS
  Filled 2021-07-03: qty 2

## 2021-07-03 MED ORDER — SODIUM CHLORIDE 0.9 % IV BOLUS
1000.0000 mL | Freq: Once | INTRAVENOUS | Status: AC
  Administered 2021-07-04: 1000 mL via INTRAVENOUS
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
## 2021-07-04 ENCOUNTER — Emergency Department: Payer: Medicaid Other

## 2021-07-04 DIAGNOSIS — M25512 Pain in left shoulder: Secondary | ICD-10-CM | POA: Diagnosis not present

## 2021-07-04 DIAGNOSIS — M40204 Unspecified kyphosis, thoracic region: Secondary | ICD-10-CM | POA: Diagnosis not present

## 2021-07-04 DIAGNOSIS — S0990XA Unspecified injury of head, initial encounter: Secondary | ICD-10-CM | POA: Diagnosis not present

## 2021-07-04 DIAGNOSIS — S3993XA Unspecified injury of pelvis, initial encounter: Secondary | ICD-10-CM | POA: Diagnosis not present

## 2021-07-04 DIAGNOSIS — S3992XA Unspecified injury of lower back, initial encounter: Secondary | ICD-10-CM | POA: Diagnosis not present

## 2021-07-04 DIAGNOSIS — S3991XA Unspecified injury of abdomen, initial encounter: Secondary | ICD-10-CM | POA: Diagnosis not present

## 2021-07-04 DIAGNOSIS — S199XXA Unspecified injury of neck, initial encounter: Secondary | ICD-10-CM | POA: Diagnosis not present

## 2021-07-04 DIAGNOSIS — S299XXA Unspecified injury of thorax, initial encounter: Secondary | ICD-10-CM | POA: Diagnosis not present

## 2021-07-04 LAB — CBC WITH DIFFERENTIAL/PLATELET
Abs Immature Granulocytes: 0.02 10*3/uL (ref 0.00–0.07)
Basophils Absolute: 0 10*3/uL (ref 0.0–0.1)
Basophils Relative: 1 %
Eosinophils Absolute: 0.1 10*3/uL (ref 0.0–0.5)
Eosinophils Relative: 2 %
HCT: 37.9 % (ref 36.0–46.0)
Hemoglobin: 12 g/dL (ref 12.0–15.0)
Immature Granulocytes: 0 %
Lymphocytes Relative: 32 %
Lymphs Abs: 2.4 10*3/uL (ref 0.7–4.0)
MCH: 27 pg (ref 26.0–34.0)
MCHC: 31.7 g/dL (ref 30.0–36.0)
MCV: 85.4 fL (ref 80.0–100.0)
Monocytes Absolute: 0.5 10*3/uL (ref 0.1–1.0)
Monocytes Relative: 7 %
Neutro Abs: 4.3 10*3/uL (ref 1.7–7.7)
Neutrophils Relative %: 58 %
Platelets: 327 10*3/uL (ref 150–400)
RBC: 4.44 MIL/uL (ref 3.87–5.11)
RDW: 13 % (ref 11.5–15.5)
WBC: 7.4 10*3/uL (ref 4.0–10.5)
nRBC: 0 % (ref 0.0–0.2)

## 2021-07-04 LAB — COMPREHENSIVE METABOLIC PANEL
ALT: 16 U/L (ref 0–44)
AST: 19 U/L (ref 15–41)
Albumin: 4 g/dL (ref 3.5–5.0)
Alkaline Phosphatase: 46 U/L (ref 38–126)
Anion gap: 8 (ref 5–15)
BUN: 14 mg/dL (ref 6–20)
CO2: 28 mmol/L (ref 22–32)
Calcium: 9.3 mg/dL (ref 8.9–10.3)
Chloride: 104 mmol/L (ref 98–111)
Creatinine, Ser: 0.78 mg/dL (ref 0.44–1.00)
GFR, Estimated: >60 mL/min (ref >60)
Glucose, Bld: 112 mg/dL — ABNORMAL HIGH (ref 70–99)
Potassium: 3.7 mmol/L (ref 3.5–5.1)
Sodium: 140 mmol/L (ref 135–145)
Total Bilirubin: 0.3 mg/dL (ref 0.3–1.2)
Total Protein: 8.1 g/dL (ref 6.5–8.1)

## 2021-07-04 LAB — LIPASE, BLOOD: Lipase: 31 U/L (ref 11–51)

## 2021-07-04 MED ORDER — NAPROXEN 500 MG PO TABS: 500.0000 mg | ORAL_TABLET | Freq: Two times a day (BID) | ORAL | 0 refills | Status: AC

## 2021-07-04 MED ORDER — CYCLOBENZAPRINE HCL 5 MG PO TABS: ORAL_TABLET | ORAL | 0 refills | Status: AC

## 2021-07-04 MED ORDER — IOHEXOL 350 MG/ML SOLN
75.0000 mL | Freq: Once | INTRAVENOUS | Status: AC | PRN
  Administered 2021-07-04: 75 mL via INTRAVENOUS

## 2021-07-04 MED ORDER — HYDROCODONE-ACETAMINOPHEN 5-325 MG PO TABS
1.0000 | ORAL_TABLET | Freq: Four times a day (QID) | ORAL | 0 refills | Status: AC | PRN
Start: 2021-07-04 — End: ?
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
## 2021-07-04 NOTE — Discharge Instructions (Signed)
1.  Wear sling as needed for comfort. ?2.  You may take medicines as needed for pain and muscle spasms (Naprosyn/Norco/Flexeril). ?3.  Apply ice to affected area several times daily. ?4.  Return to the ER for worsening symptoms, persistent vomiting, difficulty breathing or other concerns. ?

## 2021-07-13 DIAGNOSIS — R079 Chest pain, unspecified: Secondary | ICD-10-CM | POA: Diagnosis not present

## 2021-07-13 DIAGNOSIS — R0789 Other chest pain: Secondary | ICD-10-CM | POA: Diagnosis not present

## 2021-07-13 DIAGNOSIS — Z87891 Personal history of nicotine dependence: Secondary | ICD-10-CM | POA: Diagnosis not present

## 2021-07-13 DIAGNOSIS — R0602 Shortness of breath: Secondary | ICD-10-CM | POA: Diagnosis not present

## 2021-07-13 DIAGNOSIS — R319 Hematuria, unspecified: Secondary | ICD-10-CM | POA: Diagnosis not present

## 2021-07-14 DIAGNOSIS — R079 Chest pain, unspecified: Secondary | ICD-10-CM | POA: Diagnosis not present

## 2021-09-23 ENCOUNTER — Emergency Department: Payer: Medicaid Other

## 2021-09-23 ENCOUNTER — Emergency Department
Admission: EM | Admit: 2021-09-23 | Discharge: 2021-09-23 | Disposition: A | Discharge status: Home / Self Care | Payer: Medicaid Other | Attending: Emergency Medicine | Admitting: Emergency Medicine

## 2021-09-23 ENCOUNTER — Encounter: Payer: Self-pay | Admitting: Emergency Medicine

## 2021-09-23 DIAGNOSIS — Z3A10 10 weeks gestation of pregnancy: Secondary | ICD-10-CM | POA: Insufficient documentation

## 2021-09-23 DIAGNOSIS — M546 Pain in thoracic spine: Secondary | ICD-10-CM | POA: Diagnosis not present

## 2021-09-23 DIAGNOSIS — N8311 Corpus luteum cyst of right ovary: Secondary | ICD-10-CM | POA: Diagnosis not present

## 2021-09-23 DIAGNOSIS — R1030 Lower abdominal pain, unspecified: Secondary | ICD-10-CM | POA: Diagnosis not present

## 2021-09-23 DIAGNOSIS — N9489 Other specified conditions associated with female genital organs and menstrual cycle: Secondary | ICD-10-CM | POA: Insufficient documentation

## 2021-09-23 DIAGNOSIS — O26891 Other specified pregnancy related conditions, first trimester: Secondary | ICD-10-CM | POA: Diagnosis not present

## 2021-09-23 DIAGNOSIS — Z3A01 Less than 8 weeks gestation of pregnancy: Secondary | ICD-10-CM | POA: Diagnosis not present

## 2021-09-23 DIAGNOSIS — M545 Low back pain, unspecified: Secondary | ICD-10-CM | POA: Diagnosis not present

## 2021-09-23 LAB — URINALYSIS, ROUTINE W REFLEX MICROSCOPIC
Bilirubin Urine: NEGATIVE
Glucose, UA: NEGATIVE mg/dL
Hgb urine dipstick: NEGATIVE
Ketones, ur: NEGATIVE mg/dL
Leukocytes,Ua: NEGATIVE
Nitrite: NEGATIVE
Protein, ur: NEGATIVE mg/dL
Specific Gravity, Urine: 1.028 (ref 1.005–1.030)
pH: 6 (ref 5.0–8.0)

## 2021-09-23 LAB — CBC WITH DIFFERENTIAL/PLATELET
Abs Immature Granulocytes: 0.01 K/uL (ref 0.00–0.07)
Basophils Absolute: 0 K/uL (ref 0.0–0.1)
Basophils Relative: 1 %
Eosinophils Absolute: 0.1 K/uL (ref 0.0–0.5)
Eosinophils Relative: 1 %
HCT: 34.9 % — ABNORMAL LOW (ref 36.0–46.0)
Hemoglobin: 11.3 g/dL — ABNORMAL LOW (ref 12.0–15.0)
Immature Granulocytes: 0 %
Lymphocytes Relative: 32 %
Lymphs Abs: 1.8 K/uL (ref 0.7–4.0)
MCH: 27.5 pg (ref 26.0–34.0)
MCHC: 32.4 g/dL (ref 30.0–36.0)
MCV: 84.9 fL (ref 80.0–100.0)
Monocytes Absolute: 0.7 K/uL (ref 0.1–1.0)
Monocytes Relative: 13 %
Neutro Abs: 2.9 K/uL (ref 1.7–7.7)
Neutrophils Relative %: 53 %
Platelets: 272 K/uL (ref 150–400)
RBC: 4.11 MIL/uL (ref 3.87–5.11)
RDW: 12.4 % (ref 11.5–15.5)
WBC: 5.6 K/uL (ref 4.0–10.5)
nRBC: 0 % (ref 0.0–0.2)

## 2021-09-23 LAB — COMPREHENSIVE METABOLIC PANEL
ALT: 14 U/L (ref 0–44)
AST: 15 U/L (ref 15–41)
Albumin: 3.8 g/dL (ref 3.5–5.0)
Alkaline Phosphatase: 38 U/L (ref 38–126)
Anion gap: 4 — ABNORMAL LOW (ref 5–15)
BUN: 15 mg/dL (ref 6–20)
CO2: 24 mmol/L (ref 22–32)
Calcium: 9.2 mg/dL (ref 8.9–10.3)
Chloride: 110 mmol/L (ref 98–111)
Creatinine, Ser: 0.65 mg/dL (ref 0.44–1.00)
GFR, Estimated: >60 mL/min (ref >60)
Glucose, Bld: 95 mg/dL (ref 70–99)
Potassium: 3.4 mmol/L — ABNORMAL LOW (ref 3.5–5.1)
Sodium: 138 mmol/L (ref 135–145)
Total Bilirubin: 0.3 mg/dL (ref 0.3–1.2)
Total Protein: 7.2 g/dL (ref 6.5–8.1)

## 2021-09-23 LAB — POC URINE PREG, ED: Preg Test, Ur: POSITIVE — AB

## 2021-09-23 LAB — ABO/RH: ABO/RH(D): B POS

## 2021-09-23 LAB — HCG, QUANTITATIVE, PREGNANCY: hCG, Beta Chain, Quant, S: 16964 m[IU]/mL — ABNORMAL HIGH (ref <5)

## 2021-09-24 ENCOUNTER — Emergency Department
Admission: EM | Admit: 2021-09-24 | Discharge: 2021-09-24 | Disposition: A | Discharge status: Home / Self Care | Payer: Medicaid Other | Attending: Student in an Organized Health Care Education/Training Program | Admitting: Student in an Organized Health Care Education/Training Program

## 2021-09-24 ENCOUNTER — Other Ambulatory Visit: Payer: Self-pay

## 2021-09-24 DIAGNOSIS — N9489 Other specified conditions associated with female genital organs and menstrual cycle: Secondary | ICD-10-CM | POA: Diagnosis not present

## 2021-09-24 DIAGNOSIS — O209 Hemorrhage in early pregnancy, unspecified: Secondary | ICD-10-CM | POA: Diagnosis present

## 2021-09-24 DIAGNOSIS — Z3A00 Weeks of gestation of pregnancy not specified: Secondary | ICD-10-CM | POA: Insufficient documentation

## 2021-09-24 DIAGNOSIS — O039 Complete or unspecified spontaneous abortion without complication: Secondary | ICD-10-CM | POA: Diagnosis not present

## 2021-09-24 LAB — COMPREHENSIVE METABOLIC PANEL
ALT: 15 U/L (ref 0–44)
AST: 16 U/L (ref 15–41)
Albumin: 3.8 g/dL (ref 3.5–5.0)
Alkaline Phosphatase: 34 U/L — ABNORMAL LOW (ref 38–126)
Anion gap: 3 — ABNORMAL LOW (ref 5–15)
BUN: 9 mg/dL (ref 6–20)
CO2: 25 mmol/L (ref 22–32)
Calcium: 8.8 mg/dL — ABNORMAL LOW (ref 8.9–10.3)
Chloride: 109 mmol/L (ref 98–111)
Creatinine, Ser: 0.58 mg/dL (ref 0.44–1.00)
GFR, Estimated: >60 mL/min (ref >60)
Glucose, Bld: 90 mg/dL (ref 70–99)
Potassium: 3.2 mmol/L — ABNORMAL LOW (ref 3.5–5.1)
Sodium: 137 mmol/L (ref 135–145)
Total Bilirubin: 0.3 mg/dL (ref 0.3–1.2)
Total Protein: 7.1 g/dL (ref 6.5–8.1)

## 2021-09-24 LAB — CBC WITH DIFFERENTIAL/PLATELET
Abs Immature Granulocytes: 0.01 10*3/uL (ref 0.00–0.07)
Basophils Absolute: 0 10*3/uL (ref 0.0–0.1)
Basophils Relative: 0 %
Eosinophils Absolute: 0.1 10*3/uL (ref 0.0–0.5)
Eosinophils Relative: 3 %
HCT: 35 % — ABNORMAL LOW (ref 36.0–46.0)
Hemoglobin: 11.3 g/dL — ABNORMAL LOW (ref 12.0–15.0)
Immature Granulocytes: 0 %
Lymphocytes Relative: 34 %
Lymphs Abs: 1.6 10*3/uL (ref 0.7–4.0)
MCH: 27.4 pg (ref 26.0–34.0)
MCHC: 32.3 g/dL (ref 30.0–36.0)
MCV: 84.7 fL (ref 80.0–100.0)
Monocytes Absolute: 0.7 10*3/uL (ref 0.1–1.0)
Monocytes Relative: 14 %
Neutro Abs: 2.3 10*3/uL (ref 1.7–7.7)
Neutrophils Relative %: 49 %
Platelets: 260 10*3/uL (ref 150–400)
RBC: 4.13 MIL/uL (ref 3.87–5.11)
RDW: 12.4 % (ref 11.5–15.5)
WBC: 4.8 10*3/uL (ref 4.0–10.5)
nRBC: 0 % (ref 0.0–0.2)

## 2021-09-24 LAB — POC URINE PREG, ED: Preg Test, Ur: POSITIVE — AB

## 2021-09-24 LAB — HCG, QUANTITATIVE, PREGNANCY: hCG, Beta Chain, Quant, S: 12631 m[IU]/mL — ABNORMAL HIGH (ref <5)

## 2021-09-24 MED ORDER — ACETAMINOPHEN 325 MG PO TABS
650.0000 mg | ORAL_TABLET | Freq: Once | ORAL | Status: AC
  Administered 2021-09-24: 650 mg via ORAL
  Filled 2021-09-24: qty 2

## 2021-09-24 MED ORDER — POTASSIUM CHLORIDE CRYS ER 20 MEQ PO TBCR
40.0000 meq | EXTENDED_RELEASE_TABLET | Freq: Once | ORAL | Status: AC
  Administered 2021-09-24: 40 meq via ORAL
  Filled 2021-09-24: qty 2

## 2021-09-24 NOTE — ED Triage Notes (Signed)
Pt to ED POV, pt unsure of # weeks pregnant but states in first trimester, was told 11 weeks at a clinic in [place] then was seen here yesterday for lower abdominal cramping and vaginal bleeding and told was 6 weeks.  Pt states cramoing and bleeding are worse and has lower back pain. Pt unsure of amount of bleeding but states when she wipes after voiding, there is dark red blood on paper. Pain 7/10.  Pt is G4 P1A2L1.  VSS. Visibly uncomfortable in triage.

## 2021-09-24 NOTE — Discharge Instructions (Addendum)
Please make follow-up appointment with Dr. Dalbert Garnet. If symptoms do not start improving over the next 3 to 5 days, please return for reevaluation. If you develop fever, please return for reevaluation.

## 2021-09-24 NOTE — ED Provider Notes (Signed)
[facility] Provider Note  Patient Contact: 5:25 PM (approximate)   History   Abdominal Pain and Vaginal Bleeding   HPI  Wanda Schultz is a 26 y.o. female G4, P1 seen and evaluated last night with dedicated OB ultrasound less than 24 hours ago, presents to the emergency department with worsening cramping and pain as well as vaginal bleeding.  Dedicated OB ultrasound obtained last night showed single intrauterine gestational sac with embryo but no discernible embryonic heart activity concerning for nonviable pregnancy in the first trimester..  Patient denies fever and chills but states that her discomfort from cramping became more intense and she wanted to be checked out.  No chest pain, chest tightness or shortness of breath.      Physical Exam   Triage Vital Signs: ED Triage Vitals  Enc Vitals Group     BP 09/24/21 1657 (!) 160/62     Pulse Rate 09/24/21 1657 82     Resp 09/24/21 1657 15     Temp 09/24/21 1657 98.3 F (36.8 C)     Temp Source 09/24/21 1657 Oral     SpO2 09/24/21 1657 97 %     Weight 09/24/21 1656 198 lb 6.6 oz (90 kg)     Height 09/24/21 1656 5' (1.524 m)     Head Circumference --      Peak Flow --      Pain Score 09/24/21 1656 7     Pain Loc --      Pain Edu? --      Excl. in GC? --     Most recent vital signs: Vitals:   09/24/21 1657  BP: (!) 160/62  Pulse: 82  Resp: 15  Temp: 98.3 F (36.8 C)  SpO2: 97%     General: Alert and in no acute distress. Eyes:  PERRL. EOMI. Head: No acute traumatic findings ENT:      Nose: No congestion/rhinnorhea.      Mouth/Throat: Mucous membranes are moist. Neck: No stridor. No cervical spine tenderness to palpation. Cardiovascular:  Good peripheral perfusion Respiratory: Normal respiratory effort without tachypnea or retractions. Lungs CTAB. Good air entry to the bases with no decreased or absent breath sounds. Gastrointestinal: Bowel sounds 4 quadrants. Soft and nontender to  palpation. No guarding or rigidity. No palpable masses. No distention. No CVA tenderness. Genitourinary: Patient has approximately 5 to 7 mL of blood in vault with no passage of clots. Musculoskeletal: Full range of motion to all extremities.  Neurologic:  No gross focal neurologic deficits are appreciated.  Skin:   No rash noted Other:   ED Results / Procedures / Treatments   Labs (all labs ordered are listed, but only abnormal results are displayed) Labs Reviewed  CBC WITH DIFFERENTIAL/PLATELET - Abnormal; Notable for the following components:      Result Value   Hemoglobin 11.3 (*)    HCT 35.0 (*)    All other components within normal limits  COMPREHENSIVE METABOLIC PANEL - Abnormal; Notable for the following components:   Potassium 3.2 (*)    Calcium 8.8 (*)    Alkaline Phosphatase 34 (*)    Anion gap 3 (*)    All other components within normal limits  HCG, QUANTITATIVE, PREGNANCY - Abnormal; Notable for the following components:   hCG, Beta Chain, Quant, S 12,631 (*)    All other components within normal limits  POC URINE PREG, ED - Abnormal; Notable for the following components:   Preg Test, Ur POSITIVE (*)  All other components within normal limits          PROCEDURES:  Critical Care performed: No  Procedures   MEDICATIONS ORDERED IN ED: Medications  acetaminophen (TYLENOL) tablet 650 mg (650 mg Oral Given 09/24/21 1736)  potassium chloride SA (KLOR-CON M) CR tablet 40 mEq (40 mEq Oral Given 09/24/21 1832)     IMPRESSION / MDM / ASSESSMENT AND PLAN / ED COURSE  I reviewed the triage vital signs and the nursing notes.                              Assessment and plan Miscarriage 26 year old female presents to the emergency department with vaginal bleeding that started today as well as cramping.  Patient was hypertensive at triage but vital signs were otherwise reassuring.  On exam, patient was alert, active and nontoxic-appearing.  I reviewed  ultrasound conducted less than 24 hours ago which showed an intrauterine pregnancy without cardiac activity.  I repeated labs today which showed mild hypokalemia and decreasing beta-hCG.  H&H reassuring.  Vaginal exam revealed 5 to 7 mL of blood in vault without apparent clots.  History and physical exam findings consistent with miscarriage.  Patient education regarding expectant measures were given.  I cautioned patient that if her symptoms do not start improving over the next 3 to 5 days to return to the emergency department for reevaluation.  I also emphasized the importance of returning to the emergency department with worsening symptoms or fever.  Tylenol and ibuprofen alternating were recommended for discomfort.  All patient questions were answered.  FINAL CLINICAL IMPRESSION(S) / ED DIAGNOSES   Final diagnoses:  Miscarriage     Rx / DC Orders   ED Discharge Orders     None        Note:  This document was prepared using Dragon voice recognition software and may include unintentional dictation errors.   Pia Mau [place], PA-C 09/24/21 1906    Willy Eddy, MD 09/24/21 Corky [employer]s

## 2021-11-30 DIAGNOSIS — N888 Other specified noninflammatory disorders of cervix uteri: Secondary | ICD-10-CM | POA: Diagnosis not present

## 2021-11-30 DIAGNOSIS — N760 Acute vaginitis: Secondary | ICD-10-CM | POA: Diagnosis not present

## 2021-11-30 DIAGNOSIS — R1030 Lower abdominal pain, unspecified: Secondary | ICD-10-CM | POA: Diagnosis not present

## 2021-11-30 DIAGNOSIS — R109 Unspecified abdominal pain: Secondary | ICD-10-CM | POA: Diagnosis not present

## 2022-02-01 DIAGNOSIS — Z419 Encounter for procedure for purposes other than remedying health state, unspecified: Secondary | ICD-10-CM | POA: Diagnosis not present

## 2022-02-14 DIAGNOSIS — F17210 Nicotine dependence, cigarettes, uncomplicated: Secondary | ICD-10-CM | POA: Diagnosis not present

## 2022-02-14 DIAGNOSIS — R519 Headache, unspecified: Secondary | ICD-10-CM | POA: Diagnosis not present

## 2022-02-14 DIAGNOSIS — J111 Influenza due to unidentified influenza virus with other respiratory manifestations: Secondary | ICD-10-CM | POA: Diagnosis not present

## 2022-03-03 DIAGNOSIS — Z419 Encounter for procedure for purposes other than remedying health state, unspecified: Secondary | ICD-10-CM | POA: Diagnosis not present

## 2022-04-03 DIAGNOSIS — Z419 Encounter for procedure for purposes other than remedying health state, unspecified: Secondary | ICD-10-CM | POA: Diagnosis not present

## 2022-04-30 DIAGNOSIS — M79662 Pain in left lower leg: Secondary | ICD-10-CM | POA: Diagnosis not present

## 2022-04-30 DIAGNOSIS — R102 Pelvic and perineal pain: Secondary | ICD-10-CM | POA: Diagnosis not present

## 2022-04-30 DIAGNOSIS — F17210 Nicotine dependence, cigarettes, uncomplicated: Secondary | ICD-10-CM | POA: Diagnosis not present

## 2022-04-30 DIAGNOSIS — M79661 Pain in right lower leg: Secondary | ICD-10-CM | POA: Diagnosis not present

## 2022-04-30 DIAGNOSIS — R1030 Lower abdominal pain, unspecified: Secondary | ICD-10-CM | POA: Diagnosis not present

## 2022-04-30 DIAGNOSIS — N760 Acute vaginitis: Secondary | ICD-10-CM | POA: Diagnosis not present

## 2022-04-30 DIAGNOSIS — B3731 Acute candidiasis of vulva and vagina: Secondary | ICD-10-CM | POA: Diagnosis not present

## 2022-05-04 DIAGNOSIS — Z419 Encounter for procedure for purposes other than remedying health state, unspecified: Secondary | ICD-10-CM | POA: Diagnosis not present

## 2022-06-02 DIAGNOSIS — Z419 Encounter for procedure for purposes other than remedying health state, unspecified: Secondary | ICD-10-CM | POA: Diagnosis not present

## 2022-06-29 DIAGNOSIS — N941 Unspecified dyspareunia: Secondary | ICD-10-CM | POA: Diagnosis not present

## 2022-06-29 DIAGNOSIS — R102 Pelvic and perineal pain: Secondary | ICD-10-CM | POA: Diagnosis not present

## 2022-06-29 DIAGNOSIS — R1030 Lower abdominal pain, unspecified: Secondary | ICD-10-CM | POA: Diagnosis not present

## 2022-06-29 DIAGNOSIS — F17210 Nicotine dependence, cigarettes, uncomplicated: Secondary | ICD-10-CM | POA: Diagnosis not present

## 2022-06-29 DIAGNOSIS — N83202 Unspecified ovarian cyst, left side: Secondary | ICD-10-CM | POA: Diagnosis not present

## 2022-07-03 DIAGNOSIS — Z419 Encounter for procedure for purposes other than remedying health state, unspecified: Secondary | ICD-10-CM | POA: Diagnosis not present

## 2022-08-02 DIAGNOSIS — Z3202 Encounter for pregnancy test, result negative: Secondary | ICD-10-CM | POA: Diagnosis not present

## 2022-08-02 DIAGNOSIS — F17210 Nicotine dependence, cigarettes, uncomplicated: Secondary | ICD-10-CM | POA: Diagnosis not present

## 2022-08-02 DIAGNOSIS — Z008 Encounter for other general examination: Secondary | ICD-10-CM | POA: Diagnosis not present

## 2022-08-02 DIAGNOSIS — Z419 Encounter for procedure for purposes other than remedying health state, unspecified: Secondary | ICD-10-CM | POA: Diagnosis not present

## 2022-08-11 DIAGNOSIS — R0789 Other chest pain: Secondary | ICD-10-CM | POA: Diagnosis not present

## 2022-08-11 DIAGNOSIS — H9202 Otalgia, left ear: Secondary | ICD-10-CM | POA: Diagnosis not present

## 2022-08-11 DIAGNOSIS — J029 Acute pharyngitis, unspecified: Secondary | ICD-10-CM | POA: Diagnosis not present

## 2022-08-11 DIAGNOSIS — R0981 Nasal congestion: Secondary | ICD-10-CM | POA: Diagnosis not present

## 2022-08-11 DIAGNOSIS — H6692 Otitis media, unspecified, left ear: Secondary | ICD-10-CM | POA: Diagnosis not present

## 2022-09-02 DIAGNOSIS — Z419 Encounter for procedure for purposes other than remedying health state, unspecified: Secondary | ICD-10-CM | POA: Diagnosis not present

## 2022-10-02 DIAGNOSIS — Z419 Encounter for procedure for purposes other than remedying health state, unspecified: Secondary | ICD-10-CM | POA: Diagnosis not present

## 2022-11-02 DIAGNOSIS — Z419 Encounter for procedure for purposes other than remedying health state, unspecified: Secondary | ICD-10-CM | POA: Diagnosis not present

## 2022-11-22 DIAGNOSIS — M79601 Pain in right arm: Secondary | ICD-10-CM | POA: Diagnosis not present

## 2022-11-22 DIAGNOSIS — F17210 Nicotine dependence, cigarettes, uncomplicated: Secondary | ICD-10-CM | POA: Diagnosis not present

## 2022-11-22 DIAGNOSIS — J90 Pleural effusion, not elsewhere classified: Secondary | ICD-10-CM | POA: Diagnosis not present

## 2022-11-22 DIAGNOSIS — R079 Chest pain, unspecified: Secondary | ICD-10-CM | POA: Diagnosis not present

## 2022-12-03 DIAGNOSIS — Z419 Encounter for procedure for purposes other than remedying health state, unspecified: Secondary | ICD-10-CM | POA: Diagnosis not present

## 2023-01-02 DIAGNOSIS — Z419 Encounter for procedure for purposes other than remedying health state, unspecified: Secondary | ICD-10-CM | POA: Diagnosis not present

## 2023-02-02 DIAGNOSIS — Z419 Encounter for procedure for purposes other than remedying health state, unspecified: Secondary | ICD-10-CM | POA: Diagnosis not present

## 2023-03-04 DIAGNOSIS — Z419 Encounter for procedure for purposes other than remedying health state, unspecified: Secondary | ICD-10-CM | POA: Diagnosis not present

## 2023-04-04 DIAGNOSIS — Z419 Encounter for procedure for purposes other than remedying health state, unspecified: Secondary | ICD-10-CM | POA: Diagnosis not present

## 2023-05-05 DIAGNOSIS — Z419 Encounter for procedure for purposes other than remedying health state, unspecified: Secondary | ICD-10-CM | POA: Diagnosis not present

## 2023-05-05 DIAGNOSIS — L03211 Cellulitis of face: Secondary | ICD-10-CM | POA: Diagnosis not present
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
